# Patient Record
Sex: Male | Born: 1965 | Race: White | Hispanic: No | Marital: Married | Smoking: Former smoker
Health system: Southern US, Community
[De-identification: ages and names within clinical notes are randomized; demographics above are authoritative.]

## PROBLEM LIST (undated history)

## (undated) HISTORY — PX: NECK SURGERY: SHX720

---

## 2005-05-29 ENCOUNTER — Emergency Department (HOSPITAL_COMMUNITY): Admission: EM | Admit: 2005-05-29 | Discharge: 2005-05-29 | Payer: Self-pay | Admitting: Emergency Medicine

## 2007-04-26 IMAGING — CT CT HEAD W/O CM
1 series · 16 of 30 positions shown, 20 images · IV contrast (agent unspecified)
Comparison: none

CLINICAL DATA: Three syncopal episodes during the night.
 HEAD CT WITHOUT CONTRAST:
TECHNIQUE: Contiguous axial images were obtained from the base of the skull through the vertex according to standard protocol without contrast.

[Series 9103: — · axial · 0.47mm/px · z∈[-621,-471]mm · 16 of 34 slices shown, 20 images]
[im 2/34  brain]
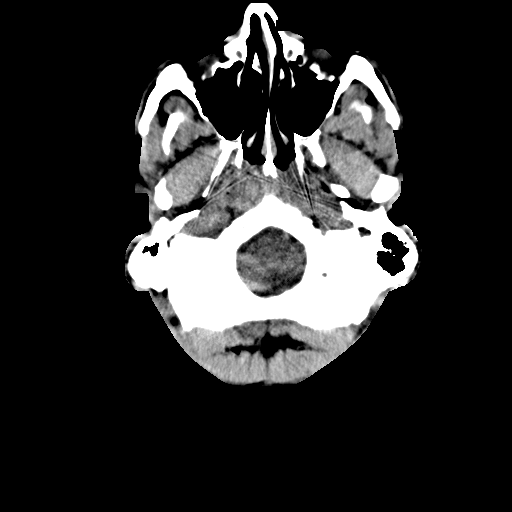
[im 2/34  bone]
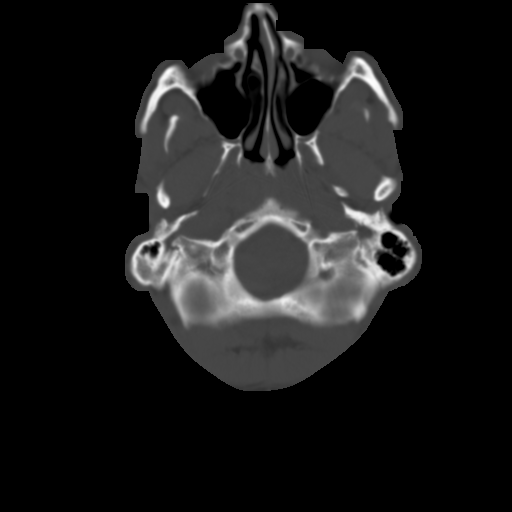
[im 4/34  brain]
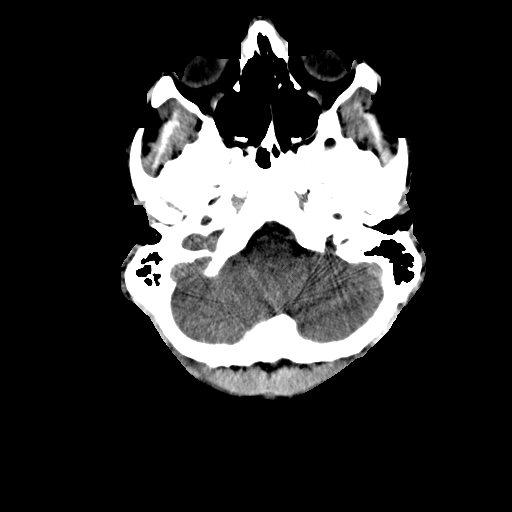
[im 6/34  brain]
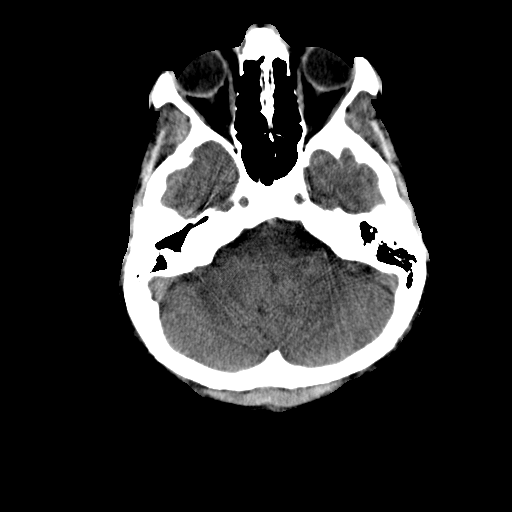
[im 8/34  brain]
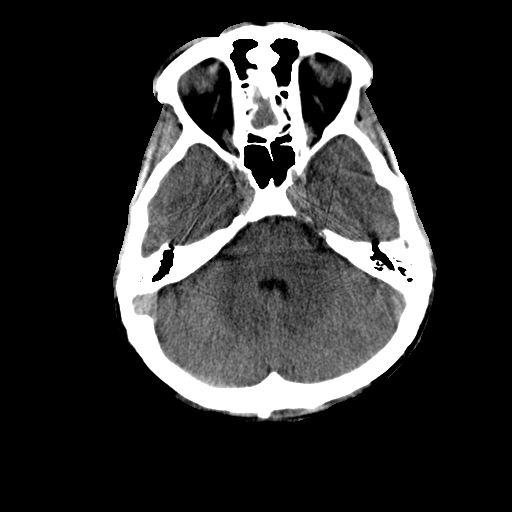
[im 10/34  brain]
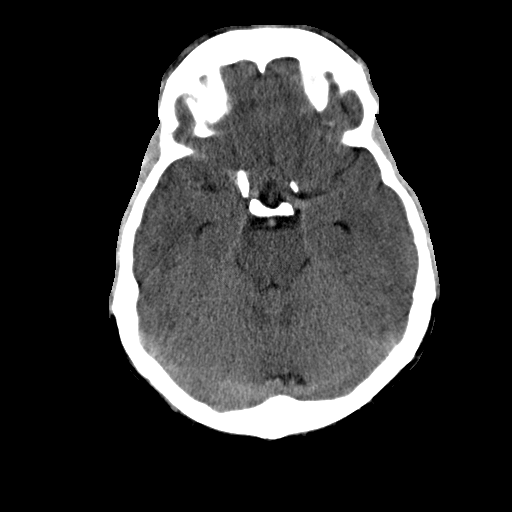
[im 10/34  bone]
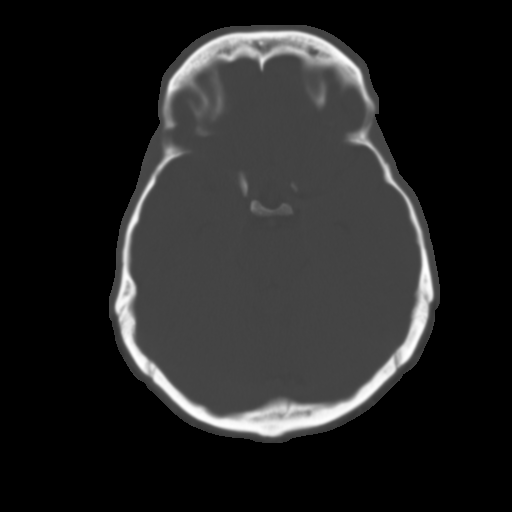
[im 12/34  brain]
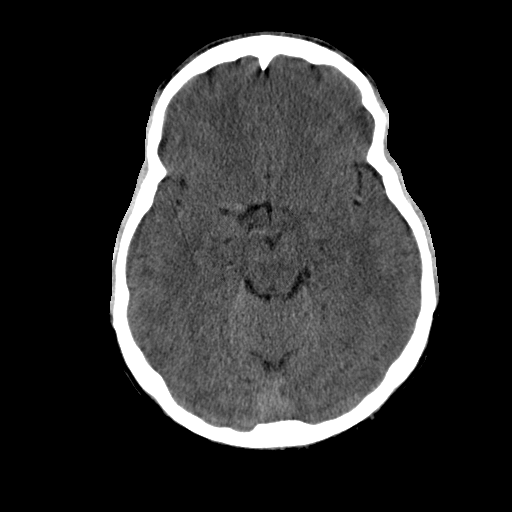
[im 14/34  brain]
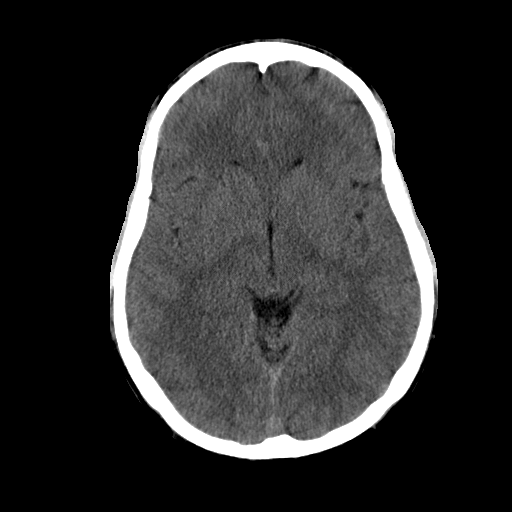
[im 16/34  brain]
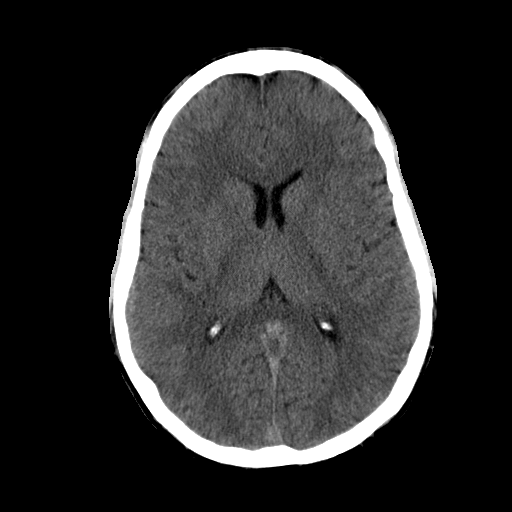
[im 18/34  brain]
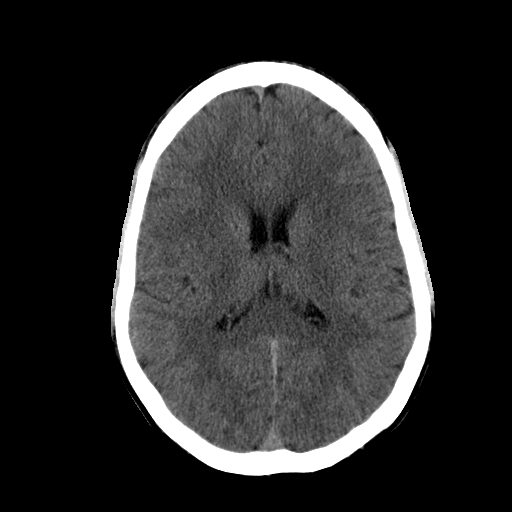
[im 18/34  bone]
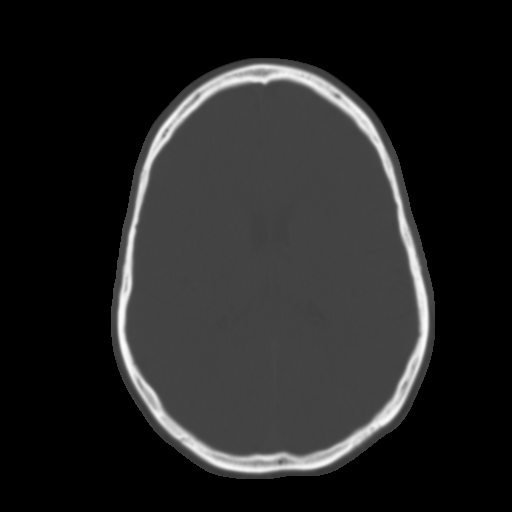
[im 20/34  brain]
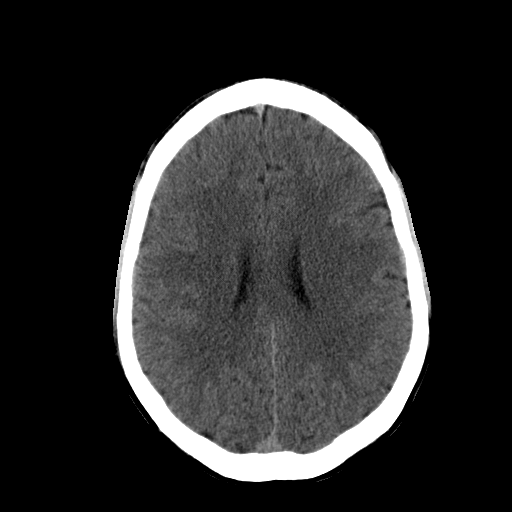
[im 22/34  brain]
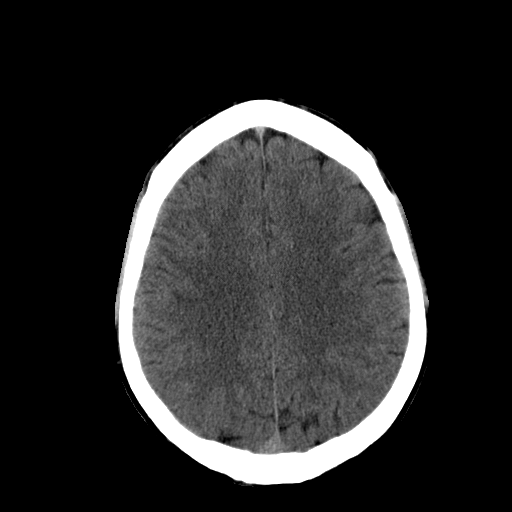
[im 24/34  brain]
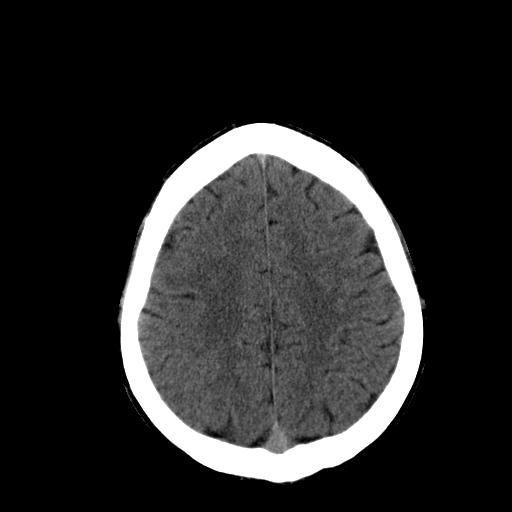
[im 26/34  brain]
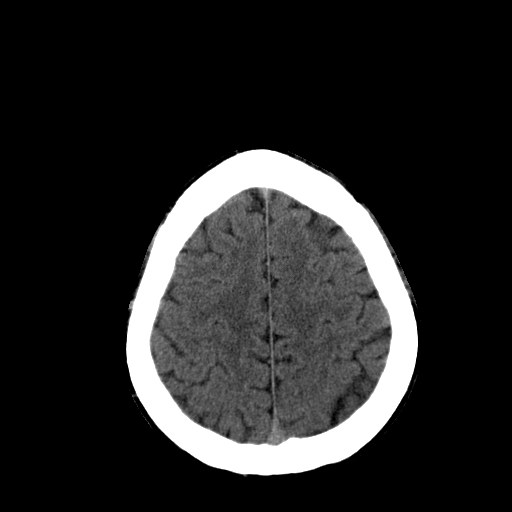
[im 26/34  bone]
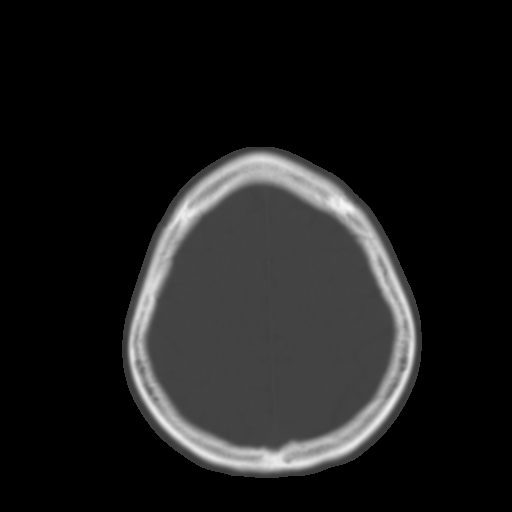
[im 28/34  brain]
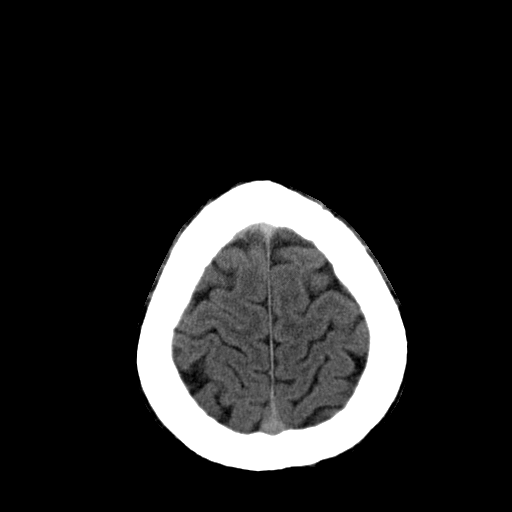
[im 30/34  brain]
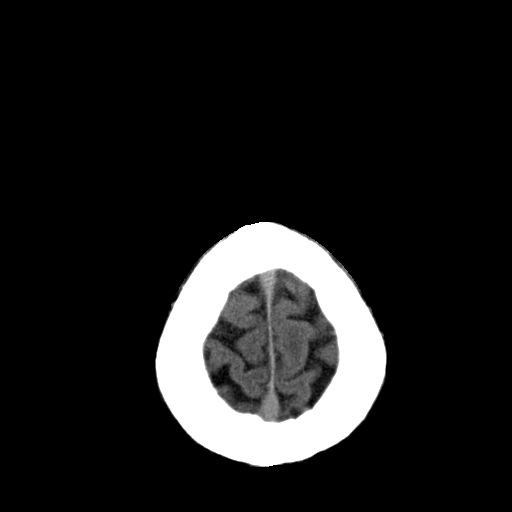
[im 32/34  brain]
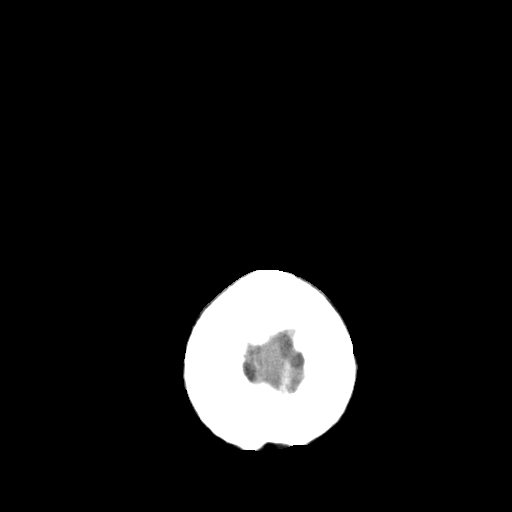

[16 of 30 positions shown; findings below may reference images not displayed]

FINDINGS: There is no evidence of intracranial hemorrhage, brain edema, or mass effect.  No other intra-axial abnormalities are seen, and the ventricles are within normal limits.  No abnormal extra-axial fluid collections or masses are identified.  No skull abnormalities are noted.
IMPRESSION: Negative non-contrast head CT.

## 2015-05-29 ENCOUNTER — Ambulatory Visit (INDEPENDENT_AMBULATORY_CARE_PROVIDER_SITE_OTHER): Payer: 59 | Admitting: Physician Assistant

## 2015-05-29 ENCOUNTER — Encounter: Payer: Self-pay | Admitting: Physician Assistant

## 2015-05-29 VITALS — BP 132/83 | HR 67 | Temp 96.2°F | Ht 73.0 in | Wt 171.0 lb

## 2015-05-29 DIAGNOSIS — H6123 Impacted cerumen, bilateral: Secondary | ICD-10-CM

## 2015-05-29 NOTE — Progress Notes (Signed)
Subjective:     Patient ID: Brendan Phillips, male   DOB: [birth year], 49 y.o.   MRN: [id number]  HPI Pt here for probable ear wax removal Hx of same ~ 4 yrs ago Noted some decrease in hearing No pain or drainage from the ears  Review of Systems  Constitutional: Negative.   HENT: Negative.        Objective:   Physical Exam  Constitutional: He appears well-developed and well-nourished.  HENT:  Right Ear: External ear normal.  Left Ear: External ear normal.  Mouth/Throat: Oropharynx is clear and moist. No oropharyngeal exudate.  Both canals impacted with cerumen Ears irrigated with removal of cerumen plugs bilat Following irrigation canals with some erythema but TM have good landmarks   Neck: Neck supple.  Lymphadenopathy:    He has no cervical adenopathy.  Nursing note and vitals reviewed.      Assessment:     Cerumen impaction bilat    Plan:     No Qtip use OTC wax softener monthly F/U prn

## 2015-05-29 NOTE — Patient Instructions (Signed)
Cerumen Impaction The structures of the external ear canal secrete a waxy substance known as cerumen. Excess cerumen can build up in the ear canal, causing a condition known as cerumen impaction. Cerumen impaction can cause ear pain and disrupt the function of the ear. The rate of cerumen production differs for each individual. In certain individuals, the configuration of the ear canal may decrease his or her ability to naturally remove cerumen. CAUSES Cerumen impaction is caused by excessive cerumen production or buildup. RISK FACTORS  Frequent use of swabs to clean ears.  Having narrow ear canals.  Having eczema.  Being dehydrated. SIGNS AND SYMPTOMS  Diminished hearing.  Ear drainage.  Ear pain.  Ear itch. TREATMENT Treatment may involve:  Over-the-counter or prescription ear drops to soften the cerumen.  Removal of cerumen by a health care provider. This may be done with:  Irrigation with warm water. This is the most common method of removal.  Ear curettes and other instruments.  Surgery. This may be done in severe cases. HOME CARE INSTRUCTIONS  Take medicines only as directed by your health care provider.  Do not insert objects into the ear with the intent of cleaning the ear. PREVENTION  Do not insert objects into the ear, even with the intent of cleaning the ear. Removing cerumen as a part of normal hygiene is not necessary, and the use of swabs in the ear canal is not recommended.  Drink enough water to keep your urine clear or pale yellow.  Control your eczema if you have it. SEEK MEDICAL CARE IF:  You develop ear pain.  You develop bleeding from the ear.  The cerumen does not clear after you use ear drops as directed.   This information is not intended to replace advice given to you by your health care provider. Make sure you discuss any questions you have with your health care provider.   Document Released: 06/26/2004 Document Revised: 06/09/2014  Document Reviewed: 01/03/2015 Elsevier Interactive Patient Education 2016 Elsevier Inc.  

## 2016-03-07 DIAGNOSIS — Z23 Encounter for immunization: Secondary | ICD-10-CM | POA: Diagnosis not present

## 2016-07-08 ENCOUNTER — Ambulatory Visit (INDEPENDENT_AMBULATORY_CARE_PROVIDER_SITE_OTHER): Payer: BLUE CROSS/BLUE SHIELD | Admitting: Family Medicine

## 2016-07-08 ENCOUNTER — Encounter: Payer: Self-pay | Admitting: Family Medicine

## 2016-07-08 VITALS — BP 119/76 | HR 67 | Temp 98.6°F | Ht 73.0 in | Wt 173.0 lb

## 2016-07-08 DIAGNOSIS — Z0000 Encounter for general adult medical examination without abnormal findings: Secondary | ICD-10-CM | POA: Diagnosis not present

## 2016-07-08 NOTE — Patient Instructions (Addendum)
Great to see you!  Health Maintenance, Male A healthy lifestyle and preventative care can promote health and wellness.  Maintain regular health, dental, and eye exams.  Eat a healthy diet. Foods like vegetables, fruits, whole grains, low-fat dairy products, and lean protein foods contain the nutrients you need and are low in calories. Decrease your intake of foods high in solid fats, added sugars, and salt. Get information about a proper diet from your health care provider, if necessary.  Regular physical exercise is one of the most important things you can do for your health. Most adults should get at least 150 minutes of moderate-intensity exercise (any activity that increases your heart rate and causes you to sweat) each week. In addition, most adults need muscle-strengthening exercises on 2 or more days a week.   Maintain a healthy weight. The body mass index (BMI) is a screening tool to identify possible weight problems. It provides an estimate of body fat based on height and weight. Your health care provider can find your BMI and can help you achieve or maintain a healthy weight. For males 20 years and older:  A BMI below 18.5 is considered underweight.  A BMI of 18.5 to 24.9 is normal.  A BMI of 25 to 29.9 is considered overweight.  A BMI of 30 and above is considered obese.  Maintain normal blood lipids and cholesterol by exercising and minimizing your intake of saturated fat. Eat a balanced diet with plenty of fruits and vegetables. Blood tests for lipids and cholesterol should begin at age 20 and be repeated every 5 years. If your lipid or cholesterol levels are high, you are over age 50, or you are at high risk for heart disease, you may need your cholesterol levels checked more frequently.Ongoing high lipid and cholesterol levels should be treated with medicines if diet and exercise are not working.  If you smoke, find out from your health care provider how to quit. If you do not  use tobacco, do not start.  Lung cancer screening is recommended for adults aged 55-80 years who are at high risk for developing lung cancer because of a history of smoking. A yearly low-dose CT scan of the lungs is recommended for people who have at least a 30-pack-year history of smoking and are current smokers or have quit within the past 15 years. A pack year of smoking is smoking an average of 1 pack of cigarettes a day for 1 year (for example, a 30-pack-year history of smoking could mean smoking 1 pack a day for 30 years or 2 packs a day for 15 years). Yearly screening should continue until the smoker has stopped smoking for at least 15 years. Yearly screening should be stopped for people who develop a health problem that would prevent them from having lung cancer treatment.  If you choose to drink alcohol, do not have more than 2 drinks per day. One drink is considered to be 12 oz (360 mL) of beer, 5 oz (150 mL) of wine, or 1.5 oz (45 mL) of liquor.  Avoid the use of street drugs. Do not share needles with anyone. Ask for help if you need support or instructions about stopping the use of drugs.  High blood pressure causes heart disease and increases the risk of stroke. High blood pressure is more likely to develop in:  People who have blood pressure in the end of the normal range (100-139/85-89 mm Hg).  People who are overweight or obese.  People   who are African American.  If you are 18-39 years of age, have your blood pressure checked every 3-5 years. If you are 40 years of age or older, have your blood pressure checked every year. You should have your blood pressure measured twice-once when you are at a hospital or clinic, and once when you are not at a hospital or clinic. Record the average of the two measurements. To check your blood pressure when you are not at a hospital or clinic, you can use:  An automated blood pressure machine at a pharmacy.  A home blood pressure monitor.  If  you are 45-79 years old, ask your health care provider if you should take aspirin to prevent heart disease.  Diabetes screening involves taking a blood sample to check your fasting blood sugar level. This should be done once every 3 years after age 45 if you are at a normal weight and without risk factors for diabetes. Testing should be considered at a younger age or be carried out more frequently if you are overweight and have at least 1 risk factor for diabetes.  Colorectal cancer can be detected and often prevented. Most routine colorectal cancer screening begins at the age of 50 and continues through age 75. However, your health care provider may recommend screening at an earlier age if you have risk factors for colon cancer. On a yearly basis, your health care provider may provide home test kits to check for hidden blood in the stool. A small camera at the end of a tube may be used to directly examine the colon (sigmoidoscopy or colonoscopy) to detect the earliest forms of colorectal cancer. Talk to your health care provider about this at age 50 when routine screening begins. A direct exam of the colon should be repeated every 5-10 years through age 75, unless early forms of precancerous polyps or small growths are found.  People who are at an increased risk for hepatitis B should be screened for this virus. You are considered at high risk for hepatitis B if:  You were born in a country where hepatitis B occurs often. Talk with your health care provider about which countries are considered high risk.  Your parents were born in a high-risk country and you have not received a shot to protect against hepatitis B (hepatitis B vaccine).  You have HIV or AIDS.  You use needles to inject street drugs.  You live with, or have sex with, someone who has hepatitis B.  You are a man who has sex with other men (MSM).  You get hemodialysis treatment.  You take certain medicines for conditions like  cancer, organ transplantation, and autoimmune conditions.  Hepatitis C blood testing is recommended for all people born from 1945 through 1965 and any individual with known risk factors for hepatitis C.  Healthy men should no longer receive prostate-specific antigen (PSA) blood tests as part of routine cancer screening. Talk to your health care provider about prostate cancer screening.  Testicular cancer screening is not recommended for adolescents or adult males who have no symptoms. Screening includes self-exam, a health care provider exam, and other screening tests. Consult with your health care provider about any symptoms you have or any concerns you have about testicular cancer.  Practice safe sex. Use condoms and avoid high-risk sexual practices to reduce the spread of sexually transmitted infections (STIs).  You should be screened for STIs, including gonorrhea and chlamydia if:  You are sexually active and are younger   than 24 years.  You are older than 24 years, and your health care provider tells you that you are at risk for this type of infection.  Your sexual activity has changed since you were last screened, and you are at an increased risk for chlamydia or gonorrhea. Ask your health care provider if you are at risk.  If you are at risk of being infected with HIV, it is recommended that you take a prescription medicine daily to prevent HIV infection. This is called pre-exposure prophylaxis (PrEP). You are considered at risk if:  You are a man who has sex with other men (MSM).  You are a heterosexual man who is sexually active with multiple partners.  You take drugs by injection.  You are sexually active with a partner who has HIV.  Talk with your health care provider about whether you are at high risk of being infected with HIV. If you choose to begin PrEP, you should first be tested for HIV. You should then be tested every 3 months for as long as you are taking PrEP.  Use  sunscreen. Apply sunscreen liberally and repeatedly throughout the day. You should seek shade when your shadow is shorter than you. Protect yourself by wearing long sleeves, pants, a wide-brimmed hat, and sunglasses year round whenever you are outdoors.  Tell your health care provider of new moles or changes in moles, especially if there is a change in shape or color. Also, tell your health care provider if a mole is larger than the size of a pencil eraser.  A one-time screening for abdominal aortic aneurysm (AAA) and surgical repair of large AAAs by ultrasound is recommended for men aged 65-75 years who are current or former smokers.  Stay current with your vaccines (immunizations). This information is not intended to replace advice given to you by your health care provider. Make sure you discuss any questions you have with your health care provider. Document Released: 11/15/2007 Document Revised: 06/09/2014 Document Reviewed: 02/20/2015 Elsevier Interactive Patient Education  2017 Elsevier Inc.  

## 2016-07-08 NOTE — Progress Notes (Signed)
   HPI  Patient presents today here for an annual physical exam.  Patient has no complaints.  He exercises 5-6 times a week, he watches his diet carefully reducing fried and fatty foods, red meats and eating lots of vegetables.  He gets annual lab work through his wife's employer, he will drop off labs in the spring when they're drawn.  He has an annual hearing test as he works in a [employer]. He like to return for ear irrigation before that.  PMH: Smoking status noted Past surgical history: Neck surgery occasional alcohol use, no drug use-social history Family history hypertension and arthritis and mother and father respectively  ROS: Per HPI  Objective: BP 119/76   Pulse 67   Temp 98.6 F (37 C) (Oral)   Ht 6\' 1"  (1.854 m)   Wt 173 lb (78.5 kg)   BMI 22.82 kg/m  Gen: NAD, alert, cooperative with exam HEENT: NCAT, EOMI, PERRL, oropharynx clear and moist, TMs partially obscured by cerumen bilaterally, nares clear CV: RRR, good S1/S2, no murmur Resp: CTABL, no wheezes, non-labored Abd: SNTND, BS present, no guarding or organomegaly Ext: No edema, warm Neuro: Alert and oriented, No gross deficits  Assessment and plan:  # Annual physical exam Usual routine healthcare maintenance reviewed Colonoscopy next year Labs per his wife's employer, he will drop off a copy when these are available Discussed healthy lifestyle choices and congratulated for his success.   Murtis SinkSam Malessa Zartman, MD [facility] 07/08/2016, 2:36 PM

## 2017-02-18 DIAGNOSIS — Z23 Encounter for immunization: Secondary | ICD-10-CM | POA: Diagnosis not present

## 2017-10-20 ENCOUNTER — Ambulatory Visit: Payer: BLUE CROSS/BLUE SHIELD | Admitting: Nurse Practitioner

## 2017-10-20 ENCOUNTER — Encounter: Payer: Self-pay | Admitting: Nurse Practitioner

## 2017-10-20 VITALS — BP 121/76 | HR 61 | Temp 97.2°F | Ht 73.0 in | Wt 178.2 lb

## 2017-10-20 DIAGNOSIS — L247 Irritant contact dermatitis due to plants, except food: Secondary | ICD-10-CM | POA: Diagnosis not present

## 2017-10-20 MED ORDER — SULFAMETHOXAZOLE-TRIMETHOPRIM 800-160 MG PO TABS: 1.0000 | ORAL_TABLET | Freq: Two times a day (BID) | ORAL | 0 refills | Status: DC

## 2017-10-20 MED ORDER — METHYLPREDNISOLONE ACETATE 80 MG/ML IJ SUSP
80.0000 mg | Freq: Once | INTRAMUSCULAR | Status: AC
  Administered 2017-10-20: 80 mg via INTRAMUSCULAR

## 2017-10-20 MED ORDER — PREDNISONE 20 MG PO TABS: ORAL_TABLET | ORAL | 0 refills | Status: DC

## 2017-10-20 NOTE — Patient Instructions (Signed)
Poison Oak Dermatitis Poison oak dermatitis is redness and soreness (inflammation) of the skin. It is caused by a chemical that is on the leaves of the poison oak plant. You may also have itching, a rash, and blisters. Symptoms often clear up in 1-2 weeks. You may get this condition by touching a poison oak plant. You can also get it by touching something that has the chemical on it. This may include animals or objects that have come in contact with the plant. Follow these instructions at home: General instructions  Take or apply over-the-counter and prescription medicines only as told by your doctor.  If you touch poison oak, wash your skin with soap and cold water right away.  Use hydrocortisone creams or calamine lotion as needed to help with itching.  Take oatmeal baths as needed. Use colloidal oatmeal. You can get this at a pharmacy or grocery store. Follow the instructions on the package.  Do not scratch or rub your skin.  While you have the rash, wash your clothes right after you wear them. Prevention   Know what poison oak looks like so you can avoid it. This plant has three leaves with flowering branches on a single stem. The leaves are fuzzy. They have a toothlike edge.  If you have touched poison oak, wash with soap and water right away. Be sure to wash under your fingernails.  When hiking or camping, wear long pants, a long-sleeved shirt, tall socks, and hiking boots. You can also use a lotion on your skin that helps to prevent contact with the chemical on the plant.  If you think that your clothes or outdoor gear came in contact with poison oak, rinse them off with a garden hose before you bring them inside your house. Contact a doctor if:  You have open sores in the rash area.  You have more redness, swelling, or pain in the affected area.  You have redness that spreads beyond the rash area.  You have fluid, blood, or pus coming from the affected area.  You have a  fever.  You have a rash over a large area of your body.  You have a rash on your eyes, mouth, or genitals.  Your rash does not improve after a few days. Get help right away if:  Your face swells or your eyes swell shut.  You have trouble breathing.  You have trouble swallowing. This information is not intended to replace advice given to you by your health care provider. Make sure you discuss any questions you have with your health care provider. Document Released: 06/21/2010 Document Revised: 10/25/2015 Document Reviewed: 10/25/2014 Elsevier Interactive Patient Education  2018 Elsevier Inc.  

## 2017-10-20 NOTE — Progress Notes (Signed)
   Subjective:    Patient ID: Brendan Phillips, male    DOB: August 29, 1965, 52 y.o.   MRN: [id number]   Chief Complaint: itching rash   HPI Patient comes in today c/o itchy rash that started over 2 weeks ago. Is really not getting any better. Red and draining on left forearm. He has been outside [employer] yard work.   Review of Systems  Constitutional: Negative.   HENT: Negative.   Respiratory: Negative.   Cardiovascular: Negative.   Gastrointestinal: Negative.   Skin: Positive for rash.  All other systems reviewed and are negative.      Objective:   Physical Exam  Constitutional: He appears well-developed and well-nourished.  Cardiovascular: Normal rate.  Pulmonary/Chest: Effort normal.  Abdominal: Soft.  Neurological: He is alert.  Skin: Skin is warm.  Erythematous vesicular patchy rash oozing yellowish excudate on right forearm Erythematous maculopapular patchy rash on right flank   BP 121/76   Pulse 61   Temp (!) 97.2 F (36.2 C) (Oral)   Ht  (1.854 m)   Wt 178 lb 3.2 oz (80.8 kg)   BMI 23.51 kg/m         Assessment & Plan:  Brendan Phillips in today with chief complaint of itching rash   1. Irritant contact dermatitis due to plants, except food Avoid scratching area clean with antibacterail soap daily RTO prn Benadryl OTC for itching - methylPREDNISolone acetate (DEPO-MEDROL) injection 80 mg - predniSONE (DELTASONE) 20 MG tablet; 2 po at sametime daily for 5 days- start tomorrow  Dispense: 10 tablet; Refill: 0 - sulfamethoxazole-trimethoprim (BACTRIM DS) 800-160 MG tablet; Take 1 tablet by mouth 2 (two) times daily.  Dispense: 20 tablet; Refill: 0  Mary-Margaret Daphine Deutscher, FNP

## 2018-02-23 DIAGNOSIS — Z23 Encounter for immunization: Secondary | ICD-10-CM | POA: Diagnosis not present

## 2019-08-07 ENCOUNTER — Ambulatory Visit: Payer: Self-pay | Attending: Internal Medicine

## 2019-08-07 DIAGNOSIS — Z23 Encounter for immunization: Secondary | ICD-10-CM

## 2019-08-07 NOTE — Progress Notes (Signed)
   Covid-19 Vaccination Clinic  Name:  Hymen Arnett    MRN: [id number] DOB: Nov 02, 1965  08/07/2019  Mr. Meints was observed post Covid-19 immunization for 15 minutes without incident. He was provided with Vaccine Information Sheet and instruction to access the V-Safe system.   Mr. Colee was instructed to call 911 with any severe reactions post vaccine: Marland Kitchen Difficulty breathing  . Swelling of face and throat  . A fast heartbeat  . A bad rash all over body  . Dizziness and weakness   Immunizations Administered    Name Date Dose VIS Date Route   Pfizer COVID-19 Vaccine 08/07/2019  6:59 PM 0.3 mL 05/13/2019 Intramuscular   Manufacturer: [employer], [employer]   Lot: [id number]   NDC: [address]-2

## 2019-08-28 ENCOUNTER — Ambulatory Visit: Payer: Self-pay | Attending: Internal Medicine

## 2019-08-28 ENCOUNTER — Ambulatory Visit: Payer: Self-pay

## 2019-08-28 DIAGNOSIS — Z23 Encounter for immunization: Secondary | ICD-10-CM

## 2019-08-28 NOTE — Progress Notes (Signed)
   Covid-19 Vaccination Clinic  Name:  Brendan Phillips    MRN: [id number] DOB: [birth year]  08/28/2019  [name] was observed post Covid-19 immunization for 15 minutes without incident. He was provided with Vaccine Information Sheet and instruction to access the V-Safe system.   [name] was instructed to call 911 with any severe reactions post vaccine: Marland Kitchen Difficulty breathing  . Swelling of face and throat  . A fast heartbeat  . A bad rash all over body  . Dizziness and weakness   Immunizations Administered    Name Date Dose VIS Date Route   Pfizer COVID-19 Vaccine 08/28/2019  4:44 PM 0.3 mL 05/13/2019 Intramuscular   Manufacturer: [employer], [employer]   Lot: [id number]   NDC: [address]-0

## 2021-03-05 DIAGNOSIS — Z23 Encounter for immunization: Secondary | ICD-10-CM | POA: Diagnosis not present

## 2021-04-30 ENCOUNTER — Encounter: Payer: Self-pay | Admitting: Nurse Practitioner

## 2021-04-30 ENCOUNTER — Ambulatory Visit: Payer: BC Managed Care – PPO | Admitting: Nurse Practitioner

## 2021-04-30 VITALS — BP 113/70 | HR 74 | Temp 97.6°F | Resp 20 | Ht 73.0 in | Wt 189.0 lb

## 2021-04-30 DIAGNOSIS — H6123 Impacted cerumen, bilateral: Secondary | ICD-10-CM | POA: Diagnosis not present

## 2021-04-30 DIAGNOSIS — Z7689 Persons encountering health services in other specified circumstances: Secondary | ICD-10-CM | POA: Insufficient documentation

## 2021-04-30 MED ORDER — DEBROX 6.5 % OT SOLN: 5.0000 [drp] | Freq: Two times a day (BID) | OTIC | 0 refills | Status: DC

## 2021-04-30 NOTE — Progress Notes (Signed)
New Patient Note  RE: Brendan Phillips MRN: [id number] DOB: 28-Sep-1965 Date of Office Visit: 04/30/2021  Chief Complaint: Establish Care (Ear issues ) and Cerumen Impaction  History of Present Illness: Cerumen Impaction: Patient presents with ear fullness for the past a few weeks.  There is a prior history of cerumen impaction.  The patient was not using ear drops to loosen wax immediately prior to this visit.  Patient  Assessment and Plan: Brendan Phillips is a 55 y.o. male with: Impacted cerumen of both ears Impacted cerumen symptoms not well controlled.  This is not new for patient.  I provided education to patient on how to use Debrox to dissolve the earwax, patient verbalized understanding.  Ears flushed in clinic.  Follow-up as needed.  Establishing care with new doctor, encounter for Patient establishing care today, provided education on the importance of doing a yearly physical with labs for health maintenance and preventative care.   Return if symptoms worsen or fail to improve.   Diagnostics:   Past Medical History: Patient Active Problem List   Diagnosis Date Noted   Impacted cerumen of both ears 04/30/2021   Establishing care with new doctor, encounter for 04/30/2021   History reviewed. No pertinent past medical history. Past Surgical History: Past Surgical History:  Procedure Laterality Date   NECK SURGERY     Medication List:  Current Outpatient Medications  Medication Sig Dispense Refill   carbamide peroxide (DEBROX) 6.5 % OTIC solution Place 5 drops into both ears 2 (two) times daily. 15 mL 0   No current facility-administered medications for this visit.   Allergies: No Known Allergies Social History: Social History   Socioeconomic History   Marital status: Married    Spouse name: Not on file   Number of children: 2   Years of education: Not on file   Highest education level: Not on file  Occupational History   Not on file  Tobacco Use   Smoking  status: Former   Smokeless tobacco: Never  Substance and Sexual Activity   Alcohol use: Yes    Alcohol/week: 1.0 standard drink    Types: 1 Cans of beer per week   Drug use: No   Sexual activity: Not on file  Other Topics Concern   Not on file  Social History Narrative   Not on file   Social Determinants of Health   Financial Resource Strain: Not on file  Food Insecurity: Not on file  Transportation Needs: Not on file  Physical Activity: Not on file  Stress: Not on file  Social Connections: Not on file       Family History: Family History  Problem Relation Age of Onset   Arthritis Mother    Hypertension Mother    Arthritis Father          Review of Systems  Constitutional: Negative.   HENT: Negative.    Eyes: Negative.   Respiratory: Negative.    Cardiovascular: Negative.   Gastrointestinal: Negative.   Genitourinary: Negative.   Musculoskeletal: Negative.   Skin: Negative.  Negative for rash.  All other systems reviewed and are negative. Objective: BP 113/70   Pulse 74   Temp 97.6 F (36.4 C)   Resp 20   Ht 6\' 1"  (1.854 m)   Wt 189 lb (85.7 kg)   SpO2 99%   BMI 24.94 kg/m  Body mass index is 24.94 kg/m. Physical Exam Vitals and nursing note reviewed.  Constitutional:      Appearance: Normal appearance.  HENT:     Head: Normocephalic.     Right Ear: There is impacted cerumen.     Left Ear: There is impacted cerumen.     Mouth/Throat:     Mouth: Mucous membranes are moist.     Pharynx: Oropharynx is clear.  Eyes:     Conjunctiva/sclera: Conjunctivae normal.  Cardiovascular:     Pulses: Normal pulses.     Heart sounds: Normal heart sounds.  Pulmonary:     Effort: Pulmonary effort is normal.     Breath sounds: Normal breath sounds.  Abdominal:     General: Bowel sounds are normal.  Neurological:     Mental Status: He is alert and oriented to person, place, and time.  Psychiatric:        Mood and Affect: Mood normal.        Behavior:  Behavior normal.   The plan was reviewed with the patient/family, and all questions/concerned were addressed.  It was my pleasure to see Brendan Phillips today and participate in his care. Please feel free to contact me with any questions or concerns.  Sincerely,  Lynnell Chad NP [facility]

## 2021-04-30 NOTE — Assessment & Plan Note (Signed)
Patient establishing care today, provided education on the importance of doing a yearly physical with labs for health maintenance and preventative care.

## 2021-04-30 NOTE — Patient Instructions (Signed)
Earwax Buildup, Adult ?The ears produce a substance called earwax that helps keep bacteria out of the ear and protects the skin in the ear canal. Occasionally, earwax can build up in the ear and cause discomfort or hearing loss. ?What are the causes? ?This condition is caused by a buildup of earwax. Ear canals are self-cleaning. Ear wax is made in the outer part of the ear canal and generally falls out in small amounts over time. ?When the self-cleaning mechanism is not working, earwax builds up and can cause decreased hearing and discomfort. Attempting to clean ears with cotton swabs can push the earwax deep into the ear canal and cause decreased hearing and pain. ?What increases the risk? ?This condition is more likely to develop in people who: ?Clean their ears often with cotton swabs. ?Pick at their ears. ?Use earplugs or in-ear headphones often, or wear hearing aids. ?The following factors may also make you more likely to develop this condition: ?Being male. ?Being of older age. ?Naturally producing more earwax. ?Having narrow ear canals. ?Having earwax that is overly thick or sticky. ?Having excess hair in the ear canal. ?Having eczema. ?Being dehydrated. ?What are the signs or symptoms? ?Symptoms of this condition include: ?Reduced or muffled hearing. ?A feeling of fullness in the ear or feeling that the ear is plugged. ?Fluid coming from the ear. ?Ear pain or an itchy ear. ?Ringing in the ear. ?Coughing. ?Balance problems. ?An obvious piece of earwax that can be seen inside the ear canal. ?How is this diagnosed? ?This condition may be diagnosed based on: ?Your symptoms. ?Your medical history. ?An ear exam. During the exam, your health care provider will look into your ear with an instrument called an otoscope. ?You may have tests, including a hearing test. ?How is this treated? ?This condition may be treated by: ?Using ear drops to soften the earwax. ?Having the earwax removed by a health care provider. The  health care provider may: ?Flush the ear with water. ?Use an instrument that has a loop on the end (curette). ?Use a suction device. ?Having surgery to remove the wax buildup. This may be done in severe cases. ?Follow these instructions at home: ? ?Take over-the-counter and prescription medicines only as told by your health care provider. ?Do not put any objects, including cotton swabs, into your ear. You can clean the opening of your ear canal with a washcloth or facial tissue. ?Follow instructions from your health care provider about cleaning your ears. Do not overclean your ears. ?Drink enough fluid to keep your urine pale yellow. This will help to thin the earwax. ?Keep all follow-up visits as told. If earwax builds up in your ears often or if you use hearing aids, consider seeing your health care provider for routine, preventive ear cleanings. Ask your health care provider how often you should schedule your cleanings. ?If you have hearing aids, clean them according to instructions from the manufacturer and your health care provider. ?Contact a health care provider if: ?You have ear pain. ?You develop a fever. ?You have pus or other fluid coming from your ear. ?You have hearing loss. ?You have ringing in your ears that does not go away. ?You feel like the room is spinning (vertigo). ?Your symptoms do not improve with treatment. ?Get help right away if: ?You have bleeding from the affected ear. ?You have severe ear pain. ?Summary ?Earwax can build up in the ear and cause discomfort or hearing loss. ?The most common symptoms of this condition include   reduced or muffled hearing, a feeling of fullness in the ear, or feeling that the ear is plugged. ?This condition may be diagnosed based on your symptoms, your medical history, and an ear exam. ?This condition may be treated by using ear drops to soften the earwax or by having the earwax removed by a health care provider. ?Do not put any objects, including cotton  swabs, into your ear. You can clean the opening of your ear canal with a washcloth or facial tissue. ?This information is not intended to replace advice given to you by your health care provider. Make sure you discuss any questions you have with your health care provider. ?Document Revised: 09/06/2019 Document Reviewed: 09/06/2019 ?Elsevier Patient Education ? 2022 Elsevier Inc. ? ?

## 2021-04-30 NOTE — Assessment & Plan Note (Signed)
Impacted cerumen symptoms not well controlled.  This is not new for patient.  I provided education to patient on how to use Debrox to dissolve the earwax, patient verbalized understanding.  Ears flushed in clinic.  Follow-up as needed.

## 2022-02-25 DIAGNOSIS — F4389 Other reactions to severe stress: Secondary | ICD-10-CM | POA: Diagnosis not present

## 2022-03-11 DIAGNOSIS — F4389 Other reactions to severe stress: Secondary | ICD-10-CM | POA: Diagnosis not present

## 2022-03-25 DIAGNOSIS — F4389 Other reactions to severe stress: Secondary | ICD-10-CM | POA: Diagnosis not present

## 2022-04-07 DIAGNOSIS — F4389 Other reactions to severe stress: Secondary | ICD-10-CM | POA: Diagnosis not present

## 2022-04-21 DIAGNOSIS — F4389 Other reactions to severe stress: Secondary | ICD-10-CM | POA: Diagnosis not present

## 2022-05-05 DIAGNOSIS — F4389 Other reactions to severe stress: Secondary | ICD-10-CM | POA: Diagnosis not present

## 2022-06-04 DIAGNOSIS — F4389 Other reactions to severe stress: Secondary | ICD-10-CM | POA: Diagnosis not present

## 2022-06-18 DIAGNOSIS — F4389 Other reactions to severe stress: Secondary | ICD-10-CM | POA: Diagnosis not present

## 2022-07-02 DIAGNOSIS — F4389 Other reactions to severe stress: Secondary | ICD-10-CM | POA: Diagnosis not present

## 2022-07-10 ENCOUNTER — Ambulatory Visit: Payer: BC Managed Care – PPO | Admitting: Family Medicine

## 2023-05-28 ENCOUNTER — Ambulatory Visit: Payer: 59 | Admitting: Nurse Practitioner

## 2023-05-28 ENCOUNTER — Encounter: Payer: Self-pay | Admitting: Nurse Practitioner

## 2023-05-28 VITALS — BP 133/78 | HR 64 | Temp 98.0°F | Ht 73.0 in | Wt 191.8 lb

## 2023-05-28 DIAGNOSIS — R21 Rash and other nonspecific skin eruption: Secondary | ICD-10-CM | POA: Diagnosis not present

## 2023-05-28 DIAGNOSIS — Z125 Encounter for screening for malignant neoplasm of prostate: Secondary | ICD-10-CM | POA: Insufficient documentation

## 2023-05-28 DIAGNOSIS — Z23 Encounter for immunization: Secondary | ICD-10-CM | POA: Insufficient documentation

## 2023-05-28 DIAGNOSIS — Z0001 Encounter for general adult medical examination with abnormal findings: Secondary | ICD-10-CM | POA: Insufficient documentation

## 2023-05-28 NOTE — Addendum Note (Signed)
Addended by: Evern Bio, Dois Davenport on: 05/28/2023 12:02 PM   Modules accepted: Level of Service

## 2023-05-28 NOTE — Progress Notes (Signed)
New Patient Office Visit  Subjective   Patient ID: Brendan Phillips, male    DOB: 1965-10-15  Age: 57 y.o. MRN: [id number]  CC:  Chief Complaint  Patient presents with   Establish Care   spot on face    Spot on right side of face for several years, getting larger    HPI Brendan Phillips 57 year old male present May 28, 2019 for to establish care and concern for rash on right side of the face.  He reports being healthy currently not taking any medication with no past medical history. New Rash: Patient complains of rash involving the face. Rash started 7 years ago that has increase in size. Appearance of rash at onset: Color of lesion(s): white. Rash has not changed over time Initial distribution: face.  Discomfort associated with rash: causes no discomfort.  Associated symptoms: none. Denies: none. Patient has not had previous evaluation of rash. Patient has not had previous treatment.  Patient has not had contacts with similar rash. Patient has not identified precipitant. Patient has not had new exposures (soaps, lotions, laundry detergents, foods, medications, plants, insects or animals.)      04/30/2021    2:51 PM 10/20/2017    6:13 PM 07/08/2016    2:20 PM  PHQ9 SCORE ONLY  PHQ-9 Total Score 5 0 0    Flu vacc administered   Outpatient Encounter Medications as of 05/28/2023  Medication Sig   carbamide peroxide (DEBROX) 6.5 % OTIC solution Place 5 drops into both ears 2 (two) times daily.   No facility-administered encounter medications on file as of 05/28/2023.    History reviewed. No pertinent past medical history.  Past Surgical History:  Procedure Laterality Date   NECK SURGERY      Family History  Problem Relation Age of Onset   Arthritis Mother    Hypertension Mother    Arthritis Father     Social History   Socioeconomic History   Marital status: Married    Spouse name: Not on file   Number of children: 2   Years of education: Not on file   Highest  education level: Some college, no degree  Occupational History   Not on file  Tobacco Use   Smoking status: Former   Smokeless tobacco: Never  Substance and Sexual Activity   Alcohol use: Yes    Alcohol/week: 1.0 standard drink of alcohol    Types: 1 Cans of beer per week   Drug use: No   Sexual activity: Not on file  Other Topics Concern   Not on file  Social History Narrative   Not on file   Social Drivers of Health   Financial Resource Strain: Low Risk  (05/24/2023)   Overall Financial Resource Strain (CARDIA)    Difficulty of Paying Living Expenses: Not very hard  Food Insecurity: No Food Insecurity (05/24/2023)   Hunger Vital Sign    Worried About Running Out of Food in the Last Year: Never true    Ran Out of Food in the Last Year: Never true  Transportation Needs: No Transportation Needs (05/24/2023)   PRAPARE - Administrator, Civil Service (Medical): No    Lack of Transportation (Non-Medical): No  Physical Activity: Insufficiently Active (05/24/2023)   Exercise Vital Sign    Days of Exercise per Week: 2 days    Minutes of Exercise per Session: 30 min  Stress: Stress Concern Present (05/24/2023)   [employer] of Occupational Health - Occupational Stress Questionnaire  Feeling of Stress : To some extent  Social Connections: Moderately Isolated (05/24/2023)   Social Connection and Isolation Panel [NHANES]    Frequency of Communication with Friends and Family: More than three times a week    Frequency of Social Gatherings with Friends and Family: Once a week    Attends Religious Services: Never    Database administrator or Organizations: No    Attends Engineer, structural: Not on file    Marital Status: Married  Catering manager Violence: Not on file    Review of Systems  Constitutional:  Negative for chills and fever.  HENT:  Negative for congestion and sore throat.   Respiratory:  Negative for cough and shortness of breath.    Cardiovascular:  Negative for chest pain and leg swelling.  Gastrointestinal:  Negative for blood in stool, constipation, nausea and vomiting.  Genitourinary:  Negative for frequency and urgency.  Musculoskeletal:  Negative for falls and myalgias.  Skin:  Positive for rash.       On right sie of the face  Neurological:  Negative for dizziness and headaches.  Endo/Heme/Allergies:  Negative for environmental allergies and polydipsia. Does not bruise/bleed easily.  Psychiatric/Behavioral:  Negative for hallucinations and suicidal ideas.    Negative unless indicated in HPI    Objective   BP 133/78   Pulse 64   Temp 98 F (36.7 C) (Temporal)   Ht 6\' 1"  (1.854 m)   Wt 191 lb 12.8 oz (87 kg)   SpO2 98%   BMI 25.30 kg/m   Physical Exam Vitals and nursing note reviewed.  Constitutional:      Appearance: Normal appearance.  HENT:     Head: Normocephalic and atraumatic.     Right Ear: Tympanic membrane, ear canal and external ear normal. There is no impacted cerumen.     Left Ear: Tympanic membrane, ear canal and external ear normal. There is no impacted cerumen.     Nose: Nose normal.     Mouth/Throat:     Mouth: Mucous membranes are moist.  Eyes:     General: No scleral icterus.    Extraocular Movements: Extraocular movements intact.     Conjunctiva/sclera: Conjunctivae normal.     Pupils: Pupils are equal, round, and reactive to light.  Cardiovascular:     Rate and Rhythm: Normal rate and regular rhythm.  Abdominal:     General: Bowel sounds are normal.     Palpations: Abdomen is soft.  Musculoskeletal:        General: Normal range of motion.     Cervical back: Normal range of motion and neck supple.     Right lower leg: No edema.     Left lower leg: No edema.  Skin:    General: Skin is warm and dry.     Findings: Rash present.  Neurological:     Mental Status: He is alert and oriented to person, place, and time. Mental status is at baseline.  Psychiatric:         Mood and Affect: Mood normal.        Behavior: Behavior normal.        Thought Content: Thought content normal.        Judgment: Judgment normal.           Assessment & Plan:  Screening PSA (prostate specific antigen) -     PSA, total and free  Rash of face -     Ambulatory referral to Dermatology  Encounter for general adult medical examination with abnormal findings -     CBC with Differential/Platelet -     CMP14+EGFR -     Lipid panel -     Thyroid Panel With TSH  Encounter for immunization -     Flu vaccine trivalent PF, 6mos and older(Flulaval,Afluria,Fluarix,Fluzone)   Brendan Phillips 57 year old male seen today to establish care, no acute distress Rash: Referral to dermatology Lab: CBC, CMP, lipid, PSA, TSH Flu shot administered today Encourage healthy lifestyle choices, including diet (rich in fruits, vegetables, and lean proteins, and low in salt and simple carbohydrates) and exercise (at least 30 minutes of moderate physical activity daily).     The above assessment and management plan was discussed with the patient. The patient verbalized understanding of and has agreed to the management plan. Patient is aware to call the clinic if they develop any new symptoms or if symptoms persist or worsen. Patient is aware when to return to the clinic for a follow-up visit. Patient educated on when it is appropriate to go to the emergency department.  Return in about 1 year (around 05/27/2024) for physical.   Martina Sinner, DNP [facility] [address] [place], [place] [address] [phone]  Note: This document was prepared by Dragon voice dictation technology and any errors that results from this process are unintentional.

## 2023-05-29 LAB — CMP14+EGFR
ALT: 43 [IU]/L (ref 0–44)
AST: 80 [IU]/L — ABNORMAL HIGH (ref 0–40)
Albumin: 4.3 g/dL (ref 3.8–4.9)
Alkaline Phosphatase: 57 [IU]/L (ref 44–121)
BUN/Creatinine Ratio: 16 (ref 9–20)
BUN: 15 mg/dL (ref 6–24)
Bilirubin Total: 0.3 mg/dL (ref 0.0–1.2)
CO2: 24 mmol/L (ref 20–29)
Calcium: 9.5 mg/dL (ref 8.7–10.2)
Chloride: 101 mmol/L (ref 96–106)
Creatinine, Ser: 0.95 mg/dL (ref 0.76–1.27)
Globulin, Total: 2.5 g/dL (ref 1.5–4.5)
Glucose: 96 mg/dL (ref 70–99)
Potassium: 4 mmol/L (ref 3.5–5.2)
Sodium: 141 mmol/L (ref 134–144)
Total Protein: 6.8 g/dL (ref 6.0–8.5)
eGFR: 94 mL/min/{1.73_m2} (ref >59)

## 2023-05-29 LAB — CBC WITH DIFFERENTIAL/PLATELET
Basophils Absolute: 0 10*3/uL (ref 0.0–0.2)
Basos: 1 %
EOS (ABSOLUTE): 0.1 10*3/uL (ref 0.0–0.4)
Eos: 2 %
Hematocrit: 46.5 % (ref 37.5–51.0)
Hemoglobin: 15.4 g/dL (ref 13.0–17.7)
Immature Grans (Abs): 0 10*3/uL (ref 0.0–0.1)
Immature Granulocytes: 0 %
Lymphocytes Absolute: 1.6 10*3/uL (ref 0.7–3.1)
Lymphs: 31 %
MCH: 29.7 pg (ref 26.6–33.0)
MCHC: 33.1 g/dL (ref 31.5–35.7)
MCV: 90 fL (ref 79–97)
Monocytes Absolute: 0.6 10*3/uL (ref 0.1–0.9)
Monocytes: 11 %
Neutrophils Absolute: 2.8 10*3/uL (ref 1.4–7.0)
Neutrophils: 55 %
Platelets: 172 10*3/uL (ref 150–450)
RBC: 5.19 x10E6/uL (ref 4.14–5.80)
RDW: 12.3 % (ref 11.6–15.4)
WBC: 5.1 10*3/uL (ref 3.4–10.8)

## 2023-05-29 LAB — LIPID PANEL
Chol/HDL Ratio: 2.8 {ratio} (ref 0.0–5.0)
Cholesterol, Total: 178 mg/dL (ref 100–199)
HDL: 63 mg/dL (ref >39)
LDL Chol Calc (NIH): 92 mg/dL (ref 0–99)
Triglycerides: 132 mg/dL (ref 0–149)
VLDL Cholesterol Cal: 23 mg/dL (ref 5–40)

## 2023-05-29 LAB — PSA, TOTAL AND FREE
PSA, Free Pct: 33 %
PSA, Free: 0.33 ng/mL
Prostate Specific Ag, Serum: 1 ng/mL (ref 0.0–4.0)

## 2023-05-29 LAB — THYROID PANEL WITH TSH
Free Thyroxine Index: 1.5 (ref 1.2–4.9)
T3 Uptake Ratio: 25 % (ref 24–39)
T4, Total: 5.8 ug/dL (ref 4.5–12.0)
TSH: 5.39 u[IU]/mL — ABNORMAL HIGH (ref 0.450–4.500)

## 2023-06-02 ENCOUNTER — Other Ambulatory Visit: Payer: Self-pay | Admitting: Nurse Practitioner

## 2023-06-02 DIAGNOSIS — R7989 Other specified abnormal findings of blood chemistry: Secondary | ICD-10-CM | POA: Insufficient documentation

## 2023-11-25 ENCOUNTER — Ambulatory Visit: Admitting: Physician Assistant

## 2023-11-25 ENCOUNTER — Encounter: Payer: Self-pay | Admitting: Physician Assistant

## 2023-11-25 VITALS — BP 126/78

## 2023-11-25 DIAGNOSIS — C44329 Squamous cell carcinoma of skin of other parts of face: Secondary | ICD-10-CM

## 2023-11-25 DIAGNOSIS — D485 Neoplasm of uncertain behavior of skin: Secondary | ICD-10-CM

## 2023-11-25 DIAGNOSIS — D492 Neoplasm of unspecified behavior of bone, soft tissue, and skin: Secondary | ICD-10-CM

## 2023-11-25 DIAGNOSIS — C4492 Squamous cell carcinoma of skin, unspecified: Secondary | ICD-10-CM

## 2023-11-25 HISTORY — DX: Squamous cell carcinoma of skin, unspecified: C44.92

## 2023-11-25 NOTE — Patient Instructions (Signed)

## 2023-11-25 NOTE — Progress Notes (Signed)
   New Patient Visit   Subjective  Brendan Phillips is a 58 y.o. male who presents for the following: Spot of right temple that has been there since at least 2018 - has photo. Denies pain or other symptoms. Does seem to be getting bigger.     The following portions of the chart were reviewed this encounter and updated as appropriate: medications, allergies, medical history  Review of Systems:  No other skin or systemic complaints except as noted in HPI or Assessment and Plan.  Objective  Well appearing patient in no apparent distress; mood and affect are within normal limits.   A focused examination was performed of the following areas: Face   Relevant exam findings are noted in the Assessment and Plan.       Right Temple 1.8 cm pink scaly patch  Assessment & Plan     NEOPLASM OF UNCERTAIN BEHAVIOR OF SKIN Right Temple Skin / nail biopsy Type of biopsy: tangential   Informed consent: discussed and consent obtained   Timeout: patient name, date of birth, surgical site, and procedure verified   Procedure prep:  Patient was prepped and draped in usual sterile fashion Prep type:  Isopropyl alcohol Anesthesia: the lesion was anesthetized in a standard fashion   Anesthetic:  1% lidocaine w/ epinephrine 1-100,000 buffered w/ 8.4% NaHCO3 Instrument used: flexible razor blade   Hemostasis achieved with: pressure, aluminum chloride and electrodesiccation   Outcome: patient tolerated procedure well   Post-procedure details: sterile dressing applied and wound care instructions given   Dressing type: bandage and petrolatum   Specimen 1 - Surgical pathology Differential Diagnosis: Hyperkeratotic AK vs SCC vs other   Check Margins: No  Return for Pending biopsy results.  I, Roseline Hutchinson, CMA, am acting as scribe for Hipolito Martinezlopez K, PA-C .   Documentation: I have reviewed the above documentation for accuracy and completeness, and I agree with the above.  Giovoni Bunch  K, PA-C

## 2023-11-26 LAB — SURGICAL PATHOLOGY

## 2023-11-28 ENCOUNTER — Ambulatory Visit: Payer: Self-pay | Admitting: Physician Assistant

## 2023-11-30 ENCOUNTER — Encounter: Payer: Self-pay | Admitting: Physician Assistant

## 2023-11-30 NOTE — Telephone Encounter (Signed)
-----   Message from [facility] K sent at 11/28/2023 12:41 PM EDT ----- Mohs Recommend also a 6 month full body skin exam.  ----- Message ----- From: Interface, [facility] In [facility] Sent: 11/26/2023   7:28 PM EDT To: Erminio MARLA Like, PA-C

## 2023-11-30 NOTE — Telephone Encounter (Signed)
 Advised patient of results and will have schedulers call to schedule appointment/hd

## 2023-12-10 ENCOUNTER — Ambulatory Visit: Payer: Self-pay | Admitting: Dermatology

## 2023-12-18 ENCOUNTER — Encounter: Payer: Self-pay | Admitting: Advanced Practice Midwife

## 2023-12-23 ENCOUNTER — Encounter: Payer: Self-pay | Admitting: Dermatology

## 2023-12-29 ENCOUNTER — Ambulatory Visit: Admitting: Dermatology

## 2023-12-29 ENCOUNTER — Encounter: Payer: Self-pay | Admitting: Dermatology

## 2023-12-29 VITALS — BP 107/67 | HR 71 | Temp 98.7°F

## 2023-12-29 DIAGNOSIS — L579 Skin changes due to chronic exposure to nonionizing radiation, unspecified: Secondary | ICD-10-CM

## 2023-12-29 DIAGNOSIS — L814 Other melanin hyperpigmentation: Secondary | ICD-10-CM

## 2023-12-29 DIAGNOSIS — C44329 Squamous cell carcinoma of skin of other parts of face: Secondary | ICD-10-CM

## 2023-12-29 DIAGNOSIS — C4492 Squamous cell carcinoma of skin, unspecified: Secondary | ICD-10-CM

## 2023-12-29 NOTE — Progress Notes (Signed)
 Follow-Up Visit   Subjective  Brendan Phillips is a 58 y.o. 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 Patient is accompanied by his wife.   The following portions of the chart were reviewed this encounter and updated as appropriate: medications, allergies, medical history  Review of Systems:  No other skin or systemic complaints except as noted in HPI or Assessment and Plan.  Objective  Well appearing patient in no apparent distress; mood and affect are within normal limits.  A focused examination was performed of the following areas: Right temple Relevant physical exam findings are noted in the Assessment and Plan.   Right Temple Healing biopsy site   Assessment & Plan   SQUAMOUS CELL CARCINOMA OF SKIN Right Temple Mohs surgery  Consent obtained: written  Anticoagulation: Is the patient taking prescription anticoagulant and/or aspirin prescribed/recommended by a physician? No   Was the anticoagulation regimen changed prior to Mohs? No    Anesthesia: Anesthesia method: local infiltration Local anesthetic: lidocaine 1% WITH epi  Procedure Details: Timeout: pre-procedure verification complete Procedure Prep: patient was prepped and draped in usual sterile fashion Prep type: chlorhexidine Biopsy accession number: [id number] Biopsy lab: [facility] Frozen section biopsy performed: No   Specimen debulked: No   Pre-Op diagnosis: squamous cell carcinoma SCC subtype: well differentiated MohsAIQ Surgical site (if tumor spans multiple areas, please select predominant area): temple Surgery side: right Surgical site (from skin exam): Right Temple Pre-operative length (cm): 1.6 Pre-operative width (cm): 1.3 Indications for Mohs surgery: anatomic location where tissue conservation is critical Previously treated? No    Micrographic Surgery Details: Post-operative length (cm):  2.4 Post-operative width (cm): 2 Number of Mohs stages: 1 Cumulative additional sections past 5 per stage: 0 Post surgery depth of defect: subcutaneous fat Is this a complex case (associate members only): No    Stage 1    Tumor features identified on Mohs section: no tumor identified    Depth of tumor invasion after stage: subcutaneous fat    Perineural invasion: no perineural invasion  Patient tolerance of procedure: tolerated well, no immediate complications  Reconstruction: Was the defect reconstructed? Yes   Was reconstruction performed by the same Mohs surgeon? Yes   Setting of reconstruction: outpatient office When was reconstruction performed? same day Type of reconstruction: linear Linear reconstruction: complex Length of linear repair (cm): 4  Opioids: Did the patient receive a prescription for opioid/narcotic related to Mohs surgery?: No    Antibiotics: Does patient meet AHA guidelines for endocarditis?: No   Does patient meet AHA guidelines for orthopedic prophylaxis?: No   Were antibiotics given on the day of surgery?: No   Did surgery breach mucosa, expose cartilage/bone, involve an area of lymphedema/inflamed/infected tissue? No    Skin repair Complexity:  Complex Final length (cm):  4.2 Informed consent: discussed and consent obtained   Timeout: patient name, date of birth, surgical site, and procedure verified   Procedure prep:  Patient was prepped and draped in usual sterile fashion Prep type:  Chlorhexidine Anesthesia: the lesion was anesthetized in a standard fashion   Anesthetic:  1% lidocaine w/ epinephrine 1-100,000 buffered w/ 8.4% NaHCO3 Reason for type of repair: reduce tension to allow closure and preserve normal anatomy   Undermining: area extensively undermined   Subcutaneous layers (deep stitches):  Suture size:  5-0 Suture type: Monocryl (poliglecaprone 25)   Stitches:  Buried vertical mattress Fine/surface layer approximation (top  stitches):  Suture size:  6-0 Suture type: fast-absorbing plain gut   Stitches: simple running   Hemostasis achieved with: suture, pressure and electrodesiccation Outcome: patient tolerated procedure well with no complications   Post-procedure details: sterile dressing applied and wound care instructions given   Dressing type: bandage, pressure dressing and petrolatum      Return in about 4 weeks (around 01/26/2024) for mohs follow up.  [name], Surg Tech III, am acting as scribe for RUFUS CHRISTELLA HOLY, MD.    12/29/2023  HISTORY OF PRESENT ILLNESS  Brendan Phillips is seen in consultation at the request of Brendan Like, PA-C for biopsy-proven Well Differentiated Squamous Cell Carcinoma. They note that the area has been present for about 6 months increasing in size with time.  There is no history of previous treatment.  Reports no other new or changing lesions and has no other complaints today.  Medications and allergies: see patient chart.  Review of systems: Reviewed 8 systems and notable for the above skin cancer.  All other systems reviewed are unremarkable/negative, unless noted in the HPI. Past medical history, surgical history, family history, social history were also reviewed and are noted in the chart/questionnaire.    PHYSICAL EXAMINATION  General: Well-appearing, in no acute distress, alert and oriented x 4. Vitals reviewed in chart (if available).   Skin: Exam reveals a 1.6 x 1.3 cm erythematous papule and biopsy scar on the right temple. There are rhytids, telangiectasias, and lentigines, consistent with photodamage.   Biopsy report(s) reviewed, confirming the diagnosis.   ASSESSMENT  1) Well Differentiated Squamous Cell Carcinoma on the right temple 2) photodamage 3) solar lentigines   PLAN   1. Due to location, size, histology, or recurrence and the likelihood of subclinical extension as well as the need to conserve normal surrounding tissue, the patient  was deemed acceptable for Mohs micrographic surgery (MMS).  The nature and purpose of the procedure, associated benefits and risks including recurrence and scarring, possible complications such as pain, infection, and bleeding, and alternative methods of treatment if appropriate were discussed with the patient during consent. The lesion location was verified by the patient, by reviewing previous notes, pathology reports, and by photographs as well as angulation measurements if available.  Informed consent was reviewed and signed by the patient, and timeout was performed at 9:30 AM. See op note below.  2. For the photodamage and solar lentigines, sun protection discussed/information given on OTC sunscreens, and we recommend continued regular follow-up with primary dermatologist every 6 months or sooner for any growing, bleeding, or changing lesions. 3. Prognosis and future surveillance discussed. 4. Letter with treatment outcome sent to referring provider. 5. Pain acetaminophen/ibuprofen MOHS MICROGRAPHIC SURGERY AND RECONSTRUCTION  Initial size:   1.6 x 1.3 cm Surgical defect/wound size: 2.4 x 2.0 cm Anesthesia:    0.33% lidocaine with 1:200,000 epinephrine EBL:    <5 mL Complications:  None Repair type:   Complex SQ suture:   5-0 Monocryl Cutaneous suture:  6-0 Plain gut Final size of the repair: 4.2 cm  Stages: 1  STAGE I: Anesthesia achieved with 0.5% lidocaine with 1:200,000 epinephrine. ChloraPrep applied. 1 section(s) excised using Mohs technique (this includes total peripheral and deep tissue margin excision and evaluation with frozen sections, excised and interpreted by the same physician). The tumor was first debulked and then excised with an approx. 2mm margin.  Hemostasis was achieved with electrocautery as needed.  The specimen was then oriented, subdivided/relaxed, inked, and processed using Mohs  technique.    Frozen section analysis revealed a clear deep and peripheral  margin.  Reconstruction  The surgical wound was then cleaned, prepped, and re-anesthetized as above. Wound edges were undermined extensively along at least one entire edge and at a distance equal to or greater than the width of the defect (see wound defect size above) in order to achieve closure and decrease wound tension and anatomic distortion. Redundant tissue repair including standing cone removal was performed. Hemostasis was achieved with electrocautery. Subcutaneous and epidermal tissues were approximated with the above sutures. The surgical site was then lightly scrubbed with sterile, saline-soaked gauze. The area was then bandaged using Vaseline ointment, non-adherent gauze, gauze pads, and tape to provide an adequate pressure dressing. The patient tolerated the procedure well, was given detailed written and verbal wound care instructions, and was discharged in good condition.   The patient will follow-up: 4 weeks.    Documentation: I have reviewed the above documentation for accuracy and completeness, and I agree with the above.  RUFUS CHRISTELLA HOLY, MD

## 2023-12-29 NOTE — Patient Instructions (Signed)

## 2023-12-31 ENCOUNTER — Encounter: Payer: Self-pay | Admitting: Dermatology

## 2024-01-25 ENCOUNTER — Encounter: Payer: Self-pay | Admitting: Physician Assistant

## 2024-01-25 ENCOUNTER — Ambulatory Visit (INDEPENDENT_AMBULATORY_CARE_PROVIDER_SITE_OTHER): Admitting: Dermatology

## 2024-01-25 ENCOUNTER — Encounter: Payer: Self-pay | Admitting: Dermatology

## 2024-01-25 ENCOUNTER — Ambulatory Visit: Admitting: Physician Assistant

## 2024-01-25 VITALS — BP 132/86

## 2024-01-25 DIAGNOSIS — D489 Neoplasm of uncertain behavior, unspecified: Secondary | ICD-10-CM

## 2024-01-25 DIAGNOSIS — D239 Other benign neoplasm of skin, unspecified: Secondary | ICD-10-CM

## 2024-01-25 DIAGNOSIS — D1801 Hemangioma of skin and subcutaneous tissue: Secondary | ICD-10-CM

## 2024-01-25 DIAGNOSIS — D492 Neoplasm of unspecified behavior of bone, soft tissue, and skin: Secondary | ICD-10-CM

## 2024-01-25 DIAGNOSIS — L814 Other melanin hyperpigmentation: Secondary | ICD-10-CM | POA: Diagnosis not present

## 2024-01-25 DIAGNOSIS — L578 Other skin changes due to chronic exposure to nonionizing radiation: Secondary | ICD-10-CM

## 2024-01-25 DIAGNOSIS — Z85828 Personal history of other malignant neoplasm of skin: Secondary | ICD-10-CM

## 2024-01-25 DIAGNOSIS — L905 Scar conditions and fibrosis of skin: Secondary | ICD-10-CM | POA: Diagnosis not present

## 2024-01-25 DIAGNOSIS — L821 Other seborrheic keratosis: Secondary | ICD-10-CM | POA: Diagnosis not present

## 2024-01-25 DIAGNOSIS — Z1283 Encounter for screening for malignant neoplasm of skin: Secondary | ICD-10-CM

## 2024-01-25 DIAGNOSIS — D229 Melanocytic nevi, unspecified: Secondary | ICD-10-CM

## 2024-01-25 DIAGNOSIS — D225 Melanocytic nevi of trunk: Secondary | ICD-10-CM

## 2024-01-25 DIAGNOSIS — W908XXA Exposure to other nonionizing radiation, initial encounter: Secondary | ICD-10-CM

## 2024-01-25 DIAGNOSIS — Z8589 Personal history of malignant neoplasm of other organs and systems: Secondary | ICD-10-CM

## 2024-01-25 DIAGNOSIS — C4492 Squamous cell carcinoma of skin, unspecified: Secondary | ICD-10-CM

## 2024-01-25 HISTORY — DX: Other benign neoplasm of skin, unspecified: D23.9

## 2024-01-25 NOTE — Progress Notes (Signed)
   New Patient Visit   Subjective  Brendan Phillips is a 58 y.o. male who presents for the following: Pt has no spots of concern today. Pt denies wearing sunscreen. Recently underwent Mohs surgery for SCC of the right temple. Here for full body skin exam.     The following portions of the chart were reviewed this encounter and updated as appropriate: medications, allergies, medical history  Review of Systems:  No other skin or systemic complaints except as noted in HPI or Assessment and Plan.  Objective  Well appearing patient in no apparent distress; mood and affect are within normal limits.  A full examination was performed including scalp, head, eyes, ears, nose, lips, neck, chest, axillae, abdomen, back, buttocks, bilateral upper extremities, bilateral lower extremities, hands, feet, fingers, toes, fingernails, and toenails. All findings within normal limits unless otherwise noted below.    Relevant exam findings are noted in the Assessment and Plan.  LENTIGINES, SEBORRHEIC KERATOSES, HEMANGIOMAS - Benign normal skin lesions - Benign-appearing - Call for any changes  MELANOCYTIC NEVI - Tan-brown and/or pink-flesh-colored symmetric macules and papules - Benign appearing on exam today - Observation - Call clinic for new or changing moles - Recommend daily use of broad spectrum spf 30+ sunscreen to sun-exposed areas.   ACTINIC DAMAGE - Chronic condition, secondary to cumulative UV/sun exposure - diffuse scaly erythematous macules with underlying dyspigmentation - Recommend daily broad spectrum sunscreen SPF 30+ to sun-exposed areas, reapply every 2 hours as needed.  - Staying in the shade or wearing long sleeves, sun glasses (UVA+UVB protection) and wide brim hats (4-inch brim around the entire circumference of the hat) are also recommended for sun protection.  - Call for new or changing lesions.  SKIN CANCER SCREENING PERFORMED TODAY  Right Abdomen (side) -  Lower 0.6   Assessment & Plan   HISTORY OF SQUAMOUS CELL CARCINOMA - no evidence of recurrence  - recommend routine skin exams yearly   NEOPLASM OF UNCERTAIN BEHAVIOR Right Abdomen (side) - Lower Epidermal / dermal shaving  Lesion diameter (cm):  0.6  Specimen 1 - Surgical pathology Differential Diagnosis: DN vs MM  Check Margins: No SCREENING EXAM FOR SKIN CANCER   LENTIGINES   SEBORRHEIC KERATOSIS   CHERRY ANGIOMA   MULTIPLE BENIGN NEVI   ACTINIC SKIN DAMAGE   HISTORY OF SQUAMOUS CELL CARCINOMA    Return in about 1 year (around 01/24/2025) for TBSE.  I, Doyce Pan, CMA, am acting as scribe for Shahara Hartsfield K, PA-C.   Documentation: I have reviewed the above documentation for accuracy and completeness, and I agree with the above.  Jakaya Jacobowitz K, PA-C

## 2024-01-25 NOTE — Patient Instructions (Addendum)

## 2024-01-25 NOTE — Patient Instructions (Signed)

## 2024-01-25 NOTE — Progress Notes (Signed)
   Follow Up Visit   Subjective  Brendan Phillips is a 58 y.o. male who presents for the following: follow up from Mohs surgery   The patient presents for follow up from Mohs surgery for a SCC on the right temple, treated on 12/29/2023, repaired with a complex closure. The patient has been bandaging the wound as directed. The endorse the following concerns: none.  The following portions of the chart were reviewed this encounter and updated as appropriate: medications, allergies, medical history  Review of Systems:  No other skin or systemic complaints except as noted in HPI or Assessment and Plan.  Objective  Well appearing patient in no apparent distress; mood and affect are within normal limits.  A focal examination was performed including the face. All findings within normal limits unless otherwise noted below.  Healing wound with mild erythema  Relevant physical exam findings are noted in the Assessment and Plan.    Assessment & Plan   Scar s/p Mohs for SCC on the right temple, treated on 12/29/2023, repaired with a complex closure. - Reassured that wound is healing well - No evidence of infection - No swelling, induration, purulence, dehiscence, or tenderness out of proportion to the clinical exam, see photo above - Discussed that scars take up to 12 months to mature from the date of surgery - Recommend SPF 30+ to scar daily to prevent purple color from UV exposure during scar maturation process - Discussed that erythema and raised appearance of scar will fade over the next 4-6 months - OK to start scar massage at 4-6 weeks post-op - Can consider silicone based products for scar healing starting at 6 weeks post-op - Ok to discontinue ointment daily   HISTORY OF SQUAMOUS CELL CARCINOMA OF THE SKIN - No evidence of recurrence today - Recommend regular full body skin exams - Recommend daily broad spectrum sunscreen SPF 30+ to sun-exposed areas, reapply every 2 hours as needed.  -  Call if any new or changing lesions are noted between office visits  Return if symptoms worsen or fail to improve.  LILLETTE Rollene Gobble, RN, am acting as scribe for RUFUS CHRISTELLA HOLY, MD .   Documentation: I have reviewed the above documentation for accuracy and completeness, and I agree with the above.  RUFUS CHRISTELLA HOLY, MD

## 2024-01-27 ENCOUNTER — Ambulatory Visit: Payer: Self-pay | Admitting: Physician Assistant

## 2024-01-27 LAB — SURGICAL PATHOLOGY

## 2024-01-28 ENCOUNTER — Ambulatory Visit: Admitting: Dermatology

## 2024-01-28 ENCOUNTER — Encounter: Payer: Self-pay | Admitting: Physician Assistant

## 2024-01-28 NOTE — Telephone Encounter (Signed)
Left message for patient to call office for results/hd 

## 2024-01-28 NOTE — Telephone Encounter (Signed)
-----   Message from [facility] sent at 01/27/2024  4:50 PM EDT ----- Right abdomen (side) - lower -- DN with moderate to severe atypia - schedule WLE with Dr. Corey.  ----- Message ----- From: Interface, [facility] Sent: 01/27/2024   4:09 PM EDT To: Erminio MARLA Like, PA-C

## 2024-01-28 NOTE — Telephone Encounter (Signed)
 Advised patient of results and sent message to schedulers.

## 2024-03-23 ENCOUNTER — Encounter: Payer: Self-pay | Admitting: Dermatology

## 2024-03-28 ENCOUNTER — Ambulatory Visit (INDEPENDENT_AMBULATORY_CARE_PROVIDER_SITE_OTHER): Admitting: Dermatology

## 2024-03-28 ENCOUNTER — Encounter: Payer: Self-pay | Admitting: Dermatology

## 2024-03-28 VITALS — BP 123/77 | HR 66

## 2024-03-28 DIAGNOSIS — D225 Melanocytic nevi of trunk: Secondary | ICD-10-CM

## 2024-03-28 DIAGNOSIS — D239 Other benign neoplasm of skin, unspecified: Secondary | ICD-10-CM

## 2024-03-28 NOTE — Patient Instructions (Signed)

## 2024-03-28 NOTE — Progress Notes (Unsigned)
   Follow-Up Visit   Subjective  Brendan Phillips is a 58 y.o. male who presents for the following: Excision of right abdomen lower  The following portions of the chart were reviewed this encounter and updated as appropriate: medications, allergies, medical history  Review of Systems:  No other skin or systemic complaints except as noted in HPI or Assessment and Plan.  Objective  Well appearing patient in no apparent distress; mood and affect are within normal limits.  A focused examination was performed of the following areas: Right abdomen lower Relevant physical exam findings are noted in the Assessment and Plan.     Assessment & Plan   DYSPLASTIC NEVUS Right Flank Skin excision - Right Flank  Excision method:  elliptical Lesion length (cm):  1.1 Lesion width (cm):  0.6 Margin per side (cm):  0.5 Total excision diameter (cm):  2.1 Informed consent: discussed and consent obtained   Timeout: patient name, date of birth, surgical site, and procedure verified   Procedure prep:  Patient was prepped and draped in usual sterile fashion Anesthesia: the lesion was anesthetized in a standard fashion   Instrument used: #15 blade   Hemostasis achieved with: suture   Hemostasis achieved with comment:  3.0 PDS with dermabond and steri strips  Specimen 1 - Surgical pathology Differential Diagnosis: [id number] Check Margins: No   No follow-ups on file.  I, Darice Smock, CMA, am acting as scribe for RUFUS CHRISTELLA HOLY, MD.   Documentation: I have reviewed the above documentation for accuracy and completeness, and I agree with the above.  RUFUS CHRISTELLA HOLY, MD

## 2024-03-30 ENCOUNTER — Ambulatory Visit: Payer: Self-pay | Admitting: Dermatology

## 2024-03-30 LAB — SURGICAL PATHOLOGY

## 2024-03-31 ENCOUNTER — Encounter: Payer: Self-pay | Admitting: Physician Assistant

## 2024-04-08 ENCOUNTER — Encounter: Payer: Self-pay | Admitting: *Deleted

## 2024-05-30 ENCOUNTER — Encounter: Payer: 59 | Admitting: Nurse Practitioner

## 2024-06-08 ENCOUNTER — Telehealth: Payer: Self-pay

## 2024-06-08 NOTE — Telephone Encounter (Signed)
 Called to reschedule physical due to provider being out of the office. LS

## 2024-06-09 ENCOUNTER — Encounter: Admitting: Nurse Practitioner

## 2024-06-16 ENCOUNTER — Encounter: Admitting: Nurse Practitioner

## 2024-08-24 ENCOUNTER — Encounter: Admitting: Family Medicine

## 2025-01-25 ENCOUNTER — Ambulatory Visit: Admitting: Physician Assistant
